# Patient Record
Sex: Male | Born: 1970 | Hispanic: Yes | Marital: Married | Smoking: Current some day smoker
Health system: Southern US, Community
[De-identification: ages and names within clinical notes are randomized; demographics above are authoritative.]

## PROBLEM LIST (undated history)

## (undated) DIAGNOSIS — I10 Essential (primary) hypertension: Secondary | ICD-10-CM

---

## 2018-05-03 ENCOUNTER — Other Ambulatory Visit: Payer: Self-pay

## 2018-05-03 ENCOUNTER — Encounter (HOSPITAL_COMMUNITY): Payer: Self-pay | Admitting: *Deleted

## 2018-05-03 ENCOUNTER — Emergency Department (HOSPITAL_COMMUNITY): Payer: Self-pay

## 2018-05-03 ENCOUNTER — Emergency Department (HOSPITAL_COMMUNITY)
Admission: EM | Admit: 2018-05-03 | Discharge: 2018-05-03 | Disposition: A | Discharge status: Home / Self Care | Payer: Self-pay | Attending: Emergency Medicine | Admitting: Emergency Medicine

## 2018-05-03 DIAGNOSIS — R05 Cough: Secondary | ICD-10-CM | POA: Insufficient documentation

## 2018-05-03 DIAGNOSIS — I10 Essential (primary) hypertension: Secondary | ICD-10-CM | POA: Insufficient documentation

## 2018-05-03 DIAGNOSIS — F17210 Nicotine dependence, cigarettes, uncomplicated: Secondary | ICD-10-CM | POA: Insufficient documentation

## 2018-05-03 DIAGNOSIS — Z23 Encounter for immunization: Secondary | ICD-10-CM | POA: Insufficient documentation

## 2018-05-03 DIAGNOSIS — W19XXXA Unspecified fall, initial encounter: Secondary | ICD-10-CM

## 2018-05-03 DIAGNOSIS — Y906 Blood alcohol level of 120-199 mg/100 ml: Secondary | ICD-10-CM | POA: Insufficient documentation

## 2018-05-03 DIAGNOSIS — F1410 Cocaine abuse, uncomplicated: Secondary | ICD-10-CM

## 2018-05-03 DIAGNOSIS — R55 Syncope and collapse: Secondary | ICD-10-CM | POA: Insufficient documentation

## 2018-05-03 DIAGNOSIS — Y999 Unspecified external cause status: Secondary | ICD-10-CM | POA: Insufficient documentation

## 2018-05-03 DIAGNOSIS — Y9389 Activity, other specified: Secondary | ICD-10-CM | POA: Insufficient documentation

## 2018-05-03 DIAGNOSIS — F1010 Alcohol abuse, uncomplicated: Secondary | ICD-10-CM

## 2018-05-03 DIAGNOSIS — S0101XA Laceration without foreign body of scalp, initial encounter: Secondary | ICD-10-CM

## 2018-05-03 DIAGNOSIS — Y9289 Other specified places as the place of occurrence of the external cause: Secondary | ICD-10-CM | POA: Insufficient documentation

## 2018-05-03 HISTORY — DX: Essential (primary) hypertension: I10

## 2018-05-03 LAB — RAPID URINE DRUG SCREEN, HOSP PERFORMED
Amphetamines: NOT DETECTED
BENZODIAZEPINES: NOT DETECTED
Barbiturates: NOT DETECTED
COCAINE: POSITIVE — AB
Opiates: NOT DETECTED
TETRAHYDROCANNABINOL: POSITIVE — AB

## 2018-05-03 LAB — CBC WITH DIFFERENTIAL/PLATELET
ABS IMMATURE GRANULOCYTES: 0.03 10*3/uL (ref 0.00–0.07)
BASOS PCT: 1 %
Basophils Absolute: 0.1 10*3/uL (ref 0.0–0.1)
Eosinophils Absolute: 0.1 10*3/uL (ref 0.0–0.5)
Eosinophils Relative: 1 %
HCT: 52.4 % — ABNORMAL HIGH (ref 39.0–52.0)
Hemoglobin: 16.9 g/dL (ref 13.0–17.0)
IMMATURE GRANULOCYTES: 0 %
Lymphocytes Relative: 31 %
Lymphs Abs: 3 10*3/uL (ref 0.7–4.0)
MCH: 31.5 pg (ref 26.0–34.0)
MCHC: 32.3 g/dL (ref 30.0–36.0)
MCV: 97.6 fL (ref 80.0–100.0)
MONOS PCT: 9 %
Monocytes Absolute: 0.9 10*3/uL (ref 0.1–1.0)
NEUTROS ABS: 5.7 10*3/uL (ref 1.7–7.7)
NEUTROS PCT: 58 %
PLATELETS: 280 10*3/uL (ref 150–400)
RBC: 5.37 MIL/uL (ref 4.22–5.81)
RDW: 12.5 % (ref 11.5–15.5)
WBC: 9.7 10*3/uL (ref 4.0–10.5)
nRBC: 0 % (ref 0.0–0.2)

## 2018-05-03 LAB — COMPREHENSIVE METABOLIC PANEL
ALT: 21 U/L (ref 0–44)
AST: 25 U/L (ref 15–41)
Albumin: 4 g/dL (ref 3.5–5.0)
Alkaline Phosphatase: 103 U/L (ref 38–126)
Anion gap: 12 (ref 5–15)
BUN: 11 mg/dL (ref 6–20)
CALCIUM: 8.9 mg/dL (ref 8.9–10.3)
CHLORIDE: 102 mmol/L (ref 98–111)
CO2: 21 mmol/L — ABNORMAL LOW (ref 22–32)
CREATININE: 0.86 mg/dL (ref 0.61–1.24)
GFR calc Af Amer: >60 mL/min (ref >60)
GFR calc non Af Amer: >60 mL/min (ref >60)
GLUCOSE: 164 mg/dL — AB (ref 70–99)
Potassium: 4.2 mmol/L (ref 3.5–5.1)
Sodium: 135 mmol/L (ref 135–145)
Total Bilirubin: 0.8 mg/dL (ref 0.3–1.2)
Total Protein: 7.4 g/dL (ref 6.5–8.1)

## 2018-05-03 LAB — I-STAT TROPONIN, ED: TROPONIN I, POC: 0 ng/mL (ref 0.00–0.08)

## 2018-05-03 LAB — ETHANOL: ALCOHOL ETHYL (B): 164 mg/dL — AB (ref <10)

## 2018-05-03 MED ORDER — LIDOCAINE-EPINEPHRINE (PF) 2 %-1:200000 IJ SOLN
20.0000 mL | Freq: Once | INTRAMUSCULAR | Status: AC
  Administered 2018-05-03: 20 mL
  Filled 2018-05-03: qty 20

## 2018-05-03 MED ORDER — TETANUS-DIPHTH-ACELL PERTUSSIS 5-2.5-18.5 LF-MCG/0.5 IM SUSP
0.5000 mL | Freq: Once | INTRAMUSCULAR | Status: AC
  Administered 2018-05-03: 0.5 mL via INTRAMUSCULAR
  Filled 2018-05-03: qty 0.5

## 2018-05-03 NOTE — ED Provider Notes (Signed)
[facility]   CSN: [id number] Arrival date & time: 05/03/18  0053     History   Chief Complaint Chief Complaint  Patient presents with  . Fall  . Loss of Consciousness    HPI Steve Mccann is a 47 y.o. male with a hx of HTN (no medication) presents to the Emergency Department with large laceration to the right parietal scalp with associated LOC of unknown duration onset approx 1 hour PTA.  Pt reports he has been drinking since 3pm this afternoon.  In addition he reports doing several lines of cocaine, smoking marijuana and vaping.  Pt reports he has had recurrent episodes of syncope from severe coughing spells and he believes this was the cause tonight. Pt was at a bar, but significant other did not witness the fall.  Per EMS, no one at the bar reported seizure like activity.  Pt was alert and oriented upon their arrival. Pt has never had a seizure.  Pt denies ASA or blood thinner usage.  Pt denies CP, SOB, headache, neck pain, abd pain, N/V/D, weakness, dizziness, numbness.  Pt does not remember all of the events surrounding the fall.      The history is provided by the patient, medical records, a significant other and the EMS personnel. No language interpreter was used.    Past Medical History:  Diagnosis Date  . Hypertension     There are no active problems to display for this patient.   History reviewed. No pertinent surgical history.      Home Medications    Prior to Admission medications   Not on File    Family History No family history on file.  Social History Social History   Tobacco Use  . Smoking status: Current Some Day Smoker  . Smokeless tobacco: Never Used  Substance Use Topics  . Alcohol use: Yes  . Drug use: Yes    Types: Cocaine     Allergies   Patient has no known allergies.   Review of Systems Review of Systems  Constitutional: Negative for appetite change, diaphoresis,  fatigue, fever and unexpected weight change.  HENT: Negative for mouth sores.   Eyes: Negative for visual disturbance.  Respiratory: Positive for cough. Negative for chest tightness, shortness of breath and wheezing.   Cardiovascular: Positive for syncope. Negative for chest pain.  Gastrointestinal: Negative for abdominal pain, constipation, diarrhea, nausea and vomiting.  Endocrine: Negative for polydipsia, polyphagia and polyuria.  Genitourinary: Negative for dysuria, frequency, hematuria and urgency.  Musculoskeletal: Negative for back pain and neck stiffness.  Skin: Positive for wound. Negative for rash.  Allergic/Immunologic: Negative for immunocompromised state.  Neurological: Positive for syncope. Negative for light-headedness and headaches.  Hematological: Does not bruise/bleed easily.  Psychiatric/Behavioral: Negative for sleep disturbance. The patient is not nervous/anxious.      Physical Exam Updated Vital Signs BP 134/88 (BP Location: Right Arm)   Pulse (!) 110   Temp (!) 97.4 F (36.3 C) (Oral)   Resp 14   SpO2 92%   Physical Exam  Constitutional: He is oriented to person, place, and time. He appears well-developed and well-nourished. No distress.  Awake, alert, nontoxic appearance  HENT:  Head: Normocephalic. Head is with contusion and with laceration.  Mouth/Throat: Oropharynx is clear and moist. No oropharyngeal exudate.  Large 13 cm irregular laceration to the right parietal scalp with persistent venous bleeding No Malocclusion No battle signs No [place] eyes No hemotympanum bilaterally  Eyes:  Pupils are equal, round, and reactive to light. Conjunctivae and EOM are normal. No scleral icterus.  Neck: Normal range of motion. Neck supple.  No midline or paraspinal tenderness  Cardiovascular: Regular rhythm and intact distal pulses. Tachycardia present.  Pulses:      Radial pulses are 2+ on the right side, and 2+ on the left side.       Posterior tibial pulses  are 2+ on the right side, and 2+ on the left side.  Pulmonary/Chest: Effort normal and breath sounds normal. No respiratory distress. He has no wheezes. He has no rhonchi.  Equal chest expansion  Abdominal: Soft. Bowel sounds are normal. He exhibits no mass. There is no tenderness. There is no rebound and no guarding.  Musculoskeletal: Normal range of motion. He exhibits no edema.  Moves all major joints without difficulty  Neurological: He is alert and oriented to person, place, and time. GCS eye subscore is 4. GCS verbal subscore is 5. GCS motor subscore is 6.  Mental Status:  Alert, oriented, thought content appropriate, able to give a coherent history. Speech fluent without evidence of aphasia. Able to follow 2 step commands without difficulty.  Cranial Nerves:  II:  pupils equal, round, reactive to light III,IV, VI: ptosis not present, extra-ocular motions intact bilaterally  V,VII: smile symmetric, facial light touch sensation equal VIII: hearing grossly normal to voice  X: uvula elevates symmetrically  XI: bilateral shoulder shrug symmetric and strong XII: midline tongue extension without fassiculations Motor:  Normal tone. 5/5 in upper and lower extremities bilaterally including strong and equal grip strength and dorsiflexion/plantar flexion Sensory:  light touch normal in all extremities.  Gait: gait testing deferred due to intoxication CV: distal pulses palpable throughout   Skin: Skin is warm and dry. He is not diaphoretic.  Psychiatric: He has a normal mood and affect.  Nursing note and vitals reviewed.    ED Treatments / Results  Labs (all labs ordered are listed, but only abnormal results are displayed) Labs Reviewed  CBC WITH DIFFERENTIAL/PLATELET - Abnormal; Notable for the following components:      Result Value   HCT 52.4 (*)    All other components within normal limits  COMPREHENSIVE METABOLIC PANEL - Abnormal; Notable for the following components:   CO2 21 (*)     Glucose, Bld 164 (*)    All other components within normal limits  ETHANOL - Abnormal; Notable for the following components:   Alcohol, Ethyl (B) 164 (*)    All other components within normal limits  RAPID URINE DRUG SCREEN, HOSP PERFORMED - Abnormal; Notable for the following components:   Cocaine POSITIVE (*)    Tetrahydrocannabinol POSITIVE (*)    All other components within normal limits  I-STAT TROPONIN, ED    EKG EKG Interpretation  Date/Time:  Monday May 03 2018 00:59:38 EST Ventricular Rate:  115 PR Interval:    QRS Duration: 105 QT Interval:  328 QTC Calculation: 454 R Axis:   71 Text Interpretation:  Sinus tachycardia Normal ECG No old tracing to compare Confirmed by Dione Booze ([id number]) on 05/03/2018 1:08:50 AM   Radiology Dg Chest 2 View  Result Date: 05/03/2018 CLINICAL DATA:  Cough. EXAM: CHEST - 2 VIEW COMPARISON:  None. FINDINGS: The heart size and mediastinal contours are within normal limits. Both lungs are clear. The visualized skeletal structures are unremarkable. IMPRESSION: No active cardiopulmonary disease. Electronically Signed   By: Myles Rosenthal M.D.   On: 05/03/2018 02:16   Ct  Head Wo Contrast  Result Date: 05/03/2018 CLINICAL DATA:  Fall with head trauma. Crack cocaine and alcohol use. EXAM: CT HEAD WITHOUT CONTRAST CT CERVICAL SPINE WITHOUT CONTRAST TECHNIQUE: Multidetector CT imaging of the head and cervical spine was performed following the standard protocol without intravenous contrast. Multiplanar CT image reconstructions of the cervical spine were also generated. COMPARISON:  None. FINDINGS: CT HEAD FINDINGS Brain: There is no mass, hemorrhage or extra-axial collection. The size and configuration of the ventricles and extra-axial CSF spaces are normal. The brain parenchyma is normal, without evidence of acute or chronic infarction. Vascular: No abnormal hyperdensity of the major intracranial arteries or dural venous sinuses. No intracranial  atherosclerosis. Skull: Large right parietal scalp hematoma.  No skull fracture. Sinuses/Orbits: No fluid levels or advanced mucosal thickening of the visualized paranasal sinuses. No mastoid or middle ear effusion. The orbits are normal. CT CERVICAL SPINE FINDINGS Alignment: No static subluxation. Facets are aligned. Occipital condyles are normally positioned. Skull base and vertebrae: No acute fracture. Soft tissues and spinal canal: No prevertebral fluid or swelling. No visible canal hematoma. Disc levels: No advanced spinal canal or neural foraminal stenosis. Upper chest: No pneumothorax, pulmonary nodule or pleural effusion. Other: Normal visualized paraspinal cervical soft tissues. IMPRESSION: 1. No acute intracranial abnormality. 2. Large right parietal scalp hematoma without skull fracture. 3. No acute fracture or static subluxation of the cervical spine. Electronically Signed   By: Deatra Robinson M.D.   On: 05/03/2018 01:56   Ct Cervical Spine Wo Contrast  Result Date: 05/03/2018 CLINICAL DATA:  Fall with head trauma. Crack cocaine and alcohol use. EXAM: CT HEAD WITHOUT CONTRAST CT CERVICAL SPINE WITHOUT CONTRAST TECHNIQUE: Multidetector CT imaging of the head and cervical spine was performed following the standard protocol without intravenous contrast. Multiplanar CT image reconstructions of the cervical spine were also generated. COMPARISON:  None. FINDINGS: CT HEAD FINDINGS Brain: There is no mass, hemorrhage or extra-axial collection. The size and configuration of the ventricles and extra-axial CSF spaces are normal. The brain parenchyma is normal, without evidence of acute or chronic infarction. Vascular: No abnormal hyperdensity of the major intracranial arteries or dural venous sinuses. No intracranial atherosclerosis. Skull: Large right parietal scalp hematoma.  No skull fracture. Sinuses/Orbits: No fluid levels or advanced mucosal thickening of the visualized paranasal sinuses. No mastoid or  middle ear effusion. The orbits are normal. CT CERVICAL SPINE FINDINGS Alignment: No static subluxation. Facets are aligned. Occipital condyles are normally positioned. Skull base and vertebrae: No acute fracture. Soft tissues and spinal canal: No prevertebral fluid or swelling. No visible canal hematoma. Disc levels: No advanced spinal canal or neural foraminal stenosis. Upper chest: No pneumothorax, pulmonary nodule or pleural effusion. Other: Normal visualized paraspinal cervical soft tissues. IMPRESSION: 1. No acute intracranial abnormality. 2. Large right parietal scalp hematoma without skull fracture. 3. No acute fracture or static subluxation of the cervical spine. Electronically Signed   By: Deatra Robinson M.D.   On: 05/03/2018 01:56    Procedures .Marland KitchenLaceration Repair Date/Time: 05/03/2018 1:36 AM Performed by: Dierdre Forth, PA-C Authorized by: Dierdre Forth, PA-C   Consent:    Consent obtained:  Verbal   Consent given by:  Patient   Risks discussed:  Infection, pain, poor cosmetic result, poor wound healing and need for additional repair   Alternatives discussed:  No treatment Anesthesia (see MAR for exact dosages):    Anesthesia method:  Local infiltration   Local anesthetic:  Lidocaine 2% WITH epi Laceration details:  Location:  Scalp   Scalp location:  R parietal   Length (cm):  13 Repair type:    Repair type:  Intermediate Pre-procedure details:    Preparation:  Patient was prepped and draped in usual sterile fashion Exploration:    Hemostasis achieved with:  Epinephrine and direct pressure   Wound exploration: entire depth of wound probed and visualized   Treatment:    Area cleansed with:  Saline   Amount of cleaning:  Extensive   Irrigation solution:  Sterile saline   Irrigation volume:    Irrigation method:  Syringe Skin repair:    Repair method:  Staples   Number of staples:  17 Approximation:    Approximation:  Close Post-procedure  details:    Dressing:  Open (no dressing)   Patient tolerance of procedure:  Tolerated well, no immediate complications   (including critical care time)  Medications Ordered in ED Medications  lidocaine-EPINEPHrine (XYLOCAINE W/EPI) 2 %-1:200000 (PF) injection 20 mL (20 mLs Infiltration Given by Other 05/03/18 0140)  Tdap (BOOSTRIX) injection 0.5 mL (0.5 mLs Intramuscular Given 05/03/18 0118)     Initial Impression / Assessment and Plan / ED Course  I have reviewed the triage vital signs and the nursing notes.  Pertinent labs & imaging results that were available during my care of the patient were reviewed by me and considered in my medical decision making (see chart for details).  Clinical Course as of May 03 499  Mon May 03, 2018  1610 Pt sleeping at this time   [HM]  0314 Tachycardia persists.  UDS + cocaine  Pulse Rate(!): 109 [HM]  0323 noted  Alcohol, Ethyl (B)(!): 164 [HM]  0323 No evidence of pneumothorax, pulmonary edema or pneumonia.  DG Chest 2 View [HM]  0323 Right parietal scalp contusion.  No intracranial hemorrhage.  No evidence of skull fracture   [HM]    Clinical Course User Index [HM] Jovon Streetman, Dahlia Client, PA-C    Patient presents after fall and questionable syncope with large laceration to the scalp.  Suspect patient's fall and potential syncope was secondary to drug and alcohol abuse.  EKG is without ischemia and troponin is negative.  Patient is without chest pain or shortness of breath.  Patient does smoke cigarettes, marijuana and vape.  Chest x-ray is without evidence of pneumothorax, pulmonary edema or pneumonia.  Troponin negative.  Patient is without respiratory distress or hypoxia.  No evidence of ARDS, ACS.  CT scan of the head and neck are without acute intracranial hemorrhage, skull fracture or subluxation of the spine.  I personally evaluated these images.  Large laceration was repaired and tetanus updated.  3:26 AM At this time patient is  sleeping.  Will allow to sober and reassess.  4:58 AM Pt has sobered and tolerate PO.  He is ambulatory with steady gait and without assistance here in the emergency department.  He remains alert and oriented.  No continued bleeding from the laceration after repair.  Discussed wound home care.  Discussed importance of decreasing the patient's a drug and alcohol usage.  He states understanding and is in agreement with the plan.  Final Clinical Impressions(s) / ED Diagnoses   Final diagnoses:  ETOH abuse  Cocaine abuse (HCC)  Fall, initial encounter  Laceration of scalp, initial encounter    ED Discharge Orders    None       Janari Yamada, Boyd Kerbs 05/03/18 0500    Dione Booze, MD 05/03/18 [phone]

## 2018-05-03 NOTE — ED Notes (Signed)
Pt ambulatory to restroom with steady gait.

## 2018-05-03 NOTE — Discharge Instructions (Addendum)

## 2018-05-03 NOTE — ED Notes (Signed)
Pt taken to CT.

## 2018-05-03 NOTE — ED Triage Notes (Signed)
Pt to ED from EMS from a bar after falling. Per patient, he believes he started coughing and fell. Per wife at bedside, pt walked up to her and fell backward hitting his head on a bar. Significant lac to R posterior head, bleeding controlled at present. Pt admits to ETOH and crack use today. Was also c/o jaw pain that wife states "I kept hitting him in the face to wake him up"

## 2019-07-14 IMAGING — DX DG CHEST 2V
2 series · 2 of 2 positions shown · non-contrast
Comparison: None.

CLINICAL DATA: Cough.

EXAM:
CHEST - 2 VIEW

[chest pa]
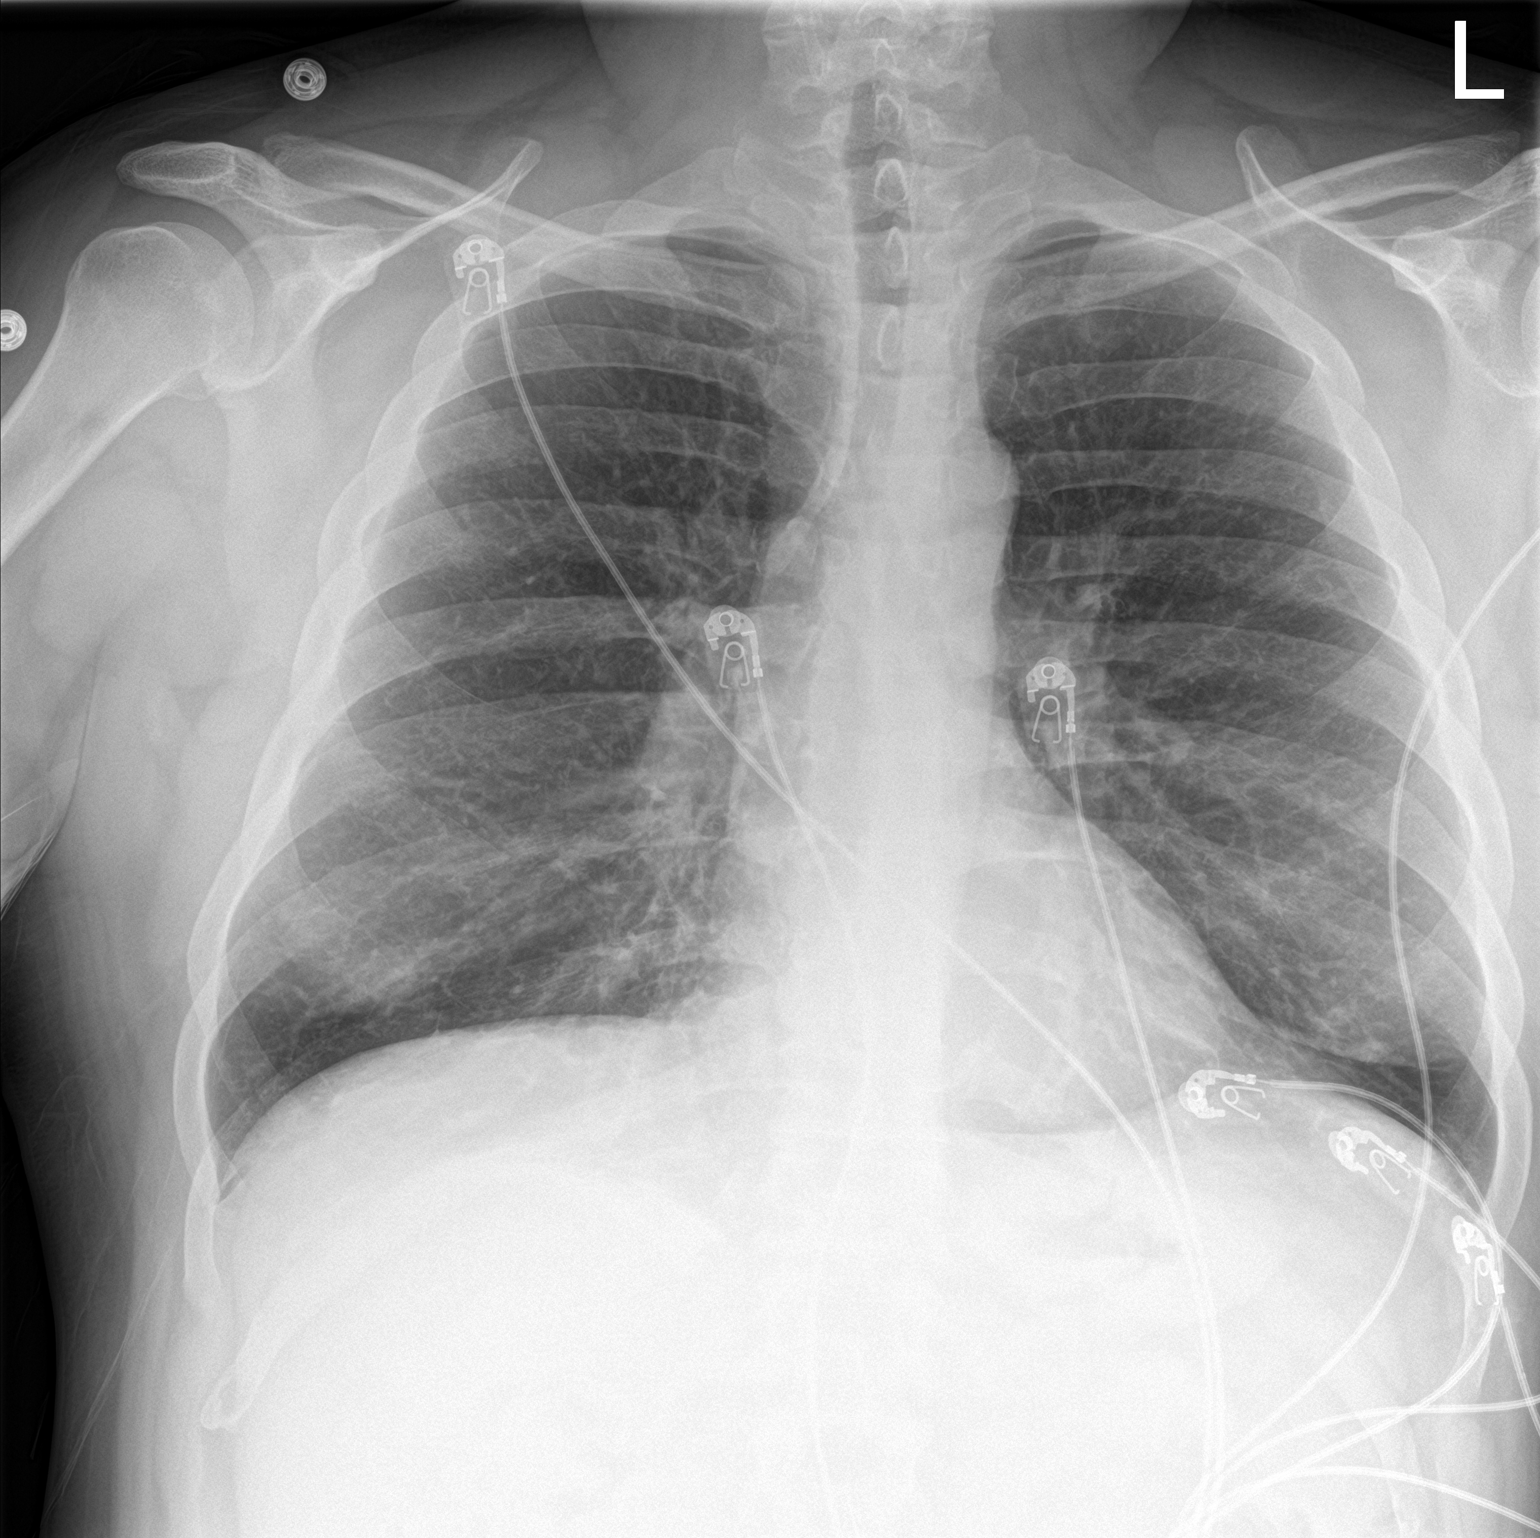

[chest lat]
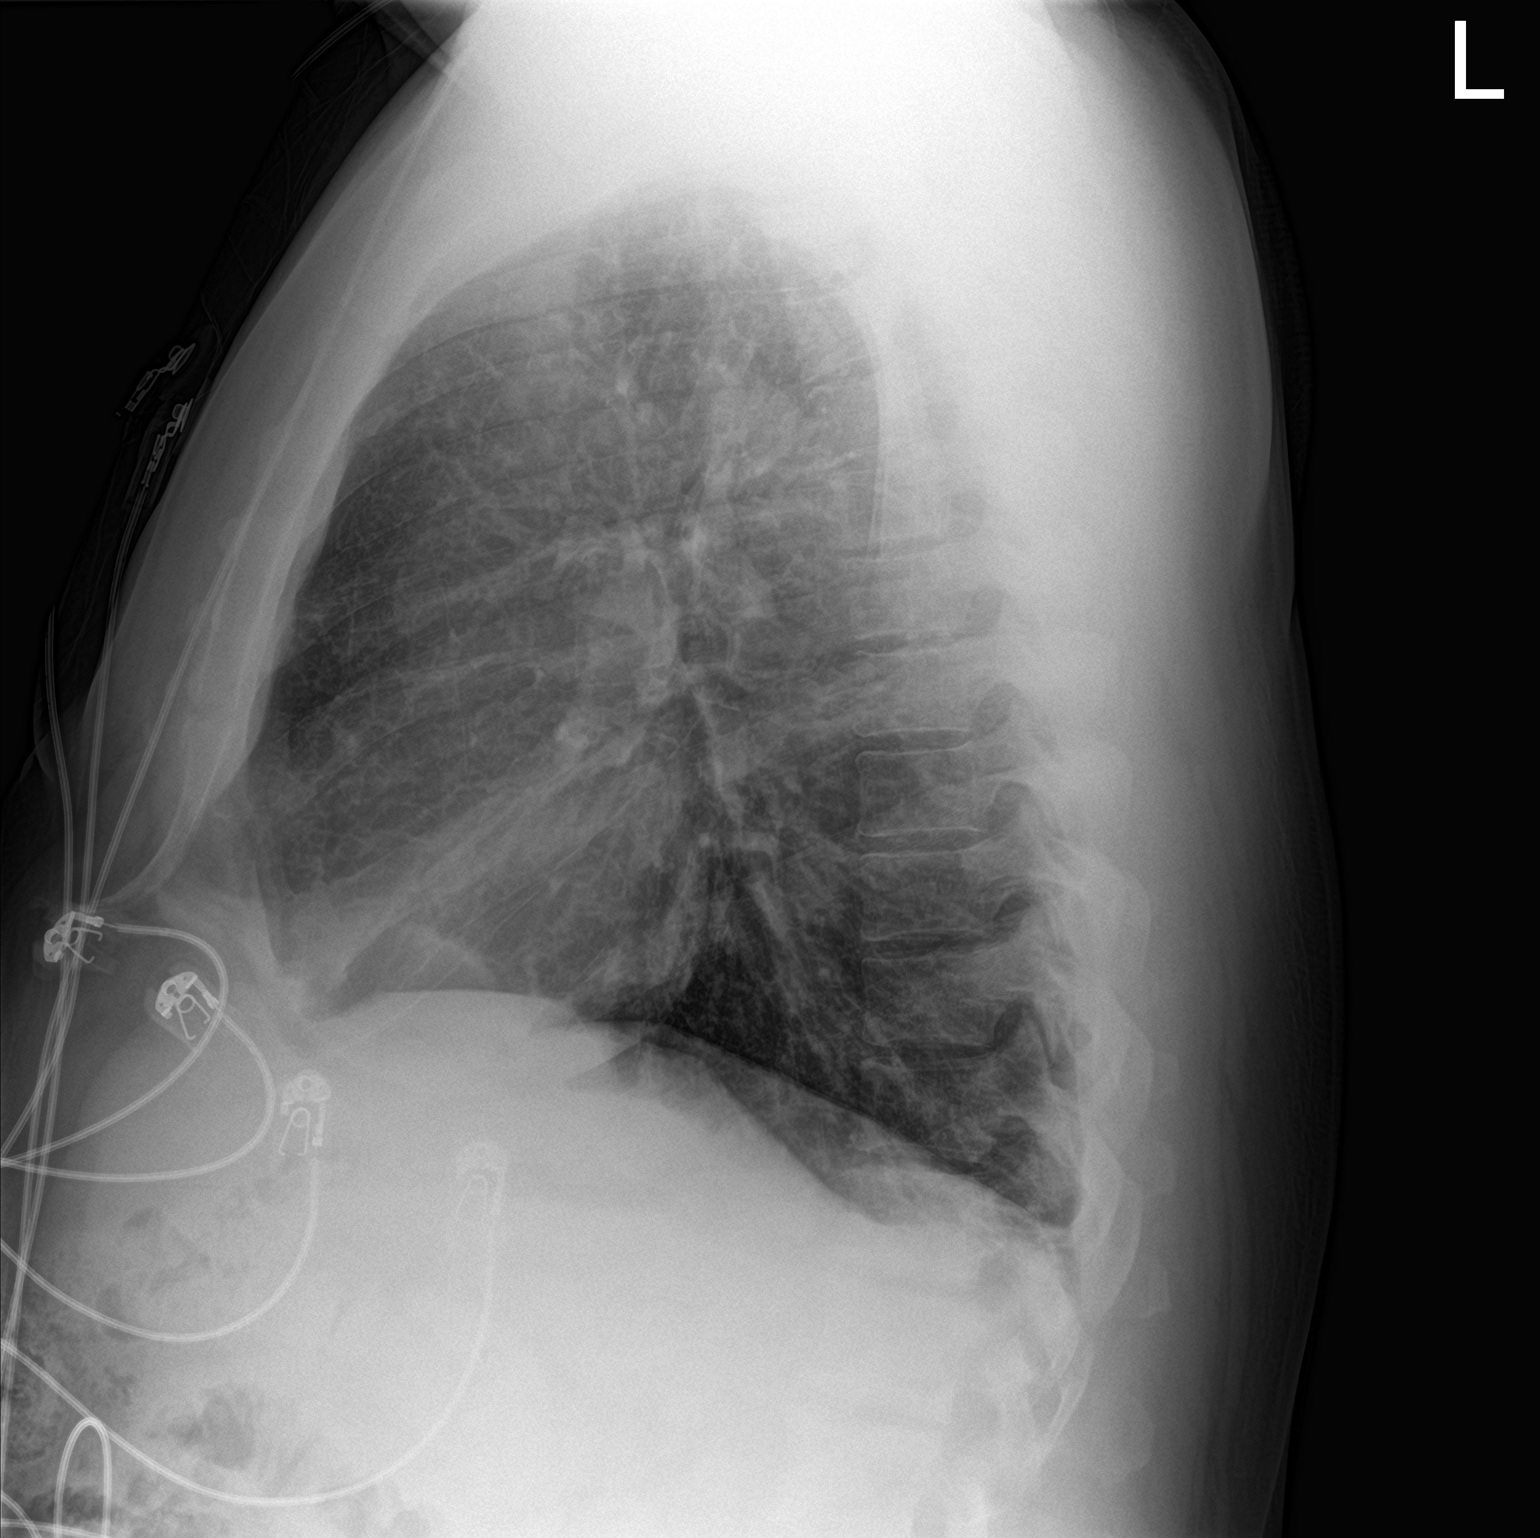

[2 of 2 positions shown; findings below may reference images not displayed]

FINDINGS: The heart size and mediastinal contours are within normal limits.
Both lungs are clear. The visualized skeletal structures are
unremarkable.
IMPRESSION: No active cardiopulmonary disease.
